# Patient Record
Sex: Male | Born: 1950 | Race: White | Hispanic: No | Marital: Married | Smoking: Former smoker
Health system: Southern US, Community
[De-identification: ages and names within clinical notes are randomized; demographics above are authoritative.]

## PROBLEM LIST (undated history)

## (undated) DIAGNOSIS — K219 Gastro-esophageal reflux disease without esophagitis: Secondary | ICD-10-CM

## (undated) DIAGNOSIS — I10 Essential (primary) hypertension: Secondary | ICD-10-CM

## (undated) DIAGNOSIS — E785 Hyperlipidemia, unspecified: Secondary | ICD-10-CM

## (undated) DIAGNOSIS — Z973 Presence of spectacles and contact lenses: Secondary | ICD-10-CM

## (undated) DIAGNOSIS — M1990 Unspecified osteoarthritis, unspecified site: Secondary | ICD-10-CM

## (undated) DIAGNOSIS — I2510 Atherosclerotic heart disease of native coronary artery without angina pectoris: Secondary | ICD-10-CM

## (undated) DIAGNOSIS — I219 Acute myocardial infarction, unspecified: Secondary | ICD-10-CM

## (undated) DIAGNOSIS — H9190 Unspecified hearing loss, unspecified ear: Secondary | ICD-10-CM

## (undated) HISTORY — PX: COLONOSCOPY: SHX174

---

## 1982-06-27 HISTORY — PX: ANKLE ARTHROPLASTY: SUR68

## 2008-06-27 HISTORY — PX: CORONARY ARTERY BYPASS GRAFT: SHX141

## 2012-07-28 HISTORY — PX: SHOULDER ARTHROSCOPY: SHX128

## 2013-04-09 ENCOUNTER — Other Ambulatory Visit: Payer: Self-pay | Admitting: Orthopedic Surgery

## 2013-04-12 ENCOUNTER — Encounter (HOSPITAL_BASED_OUTPATIENT_CLINIC_OR_DEPARTMENT_OTHER): Payer: Self-pay | Admitting: *Deleted

## 2013-04-12 NOTE — Progress Notes (Signed)
Pt had this shoulder done [place] 2/14-still having pain-problems-to come here for dr sypher-bring all meds and overnight bag-will need istat-had a cardiac work up [place] prior to surgery 2/14-have [facility] notes and ekg

## 2013-04-17 ENCOUNTER — Encounter (HOSPITAL_BASED_OUTPATIENT_CLINIC_OR_DEPARTMENT_OTHER): Payer: Self-pay | Admitting: *Deleted

## 2013-04-17 NOTE — H&P (Signed)
Daryl Cox is an 62 y.o. male.   Chief Complaint: c/o chronic and progressive right shoulder pain  HPI: .  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  He presents for evaluation of his right shoulder.    On 04/21/12 he sustained a significant strain injury pulling a pin on the 5th wheel of a trailer.  He was subsequently evaluated by a number of physicians in [place], [place], ultimately, Dr. Claudean Severance Cox.   He had plain films of his shoulder demonstrating degenerative arthritis of the [facility] joint.    He had a MRI of his shoulder on 05/23/12 demonstrating severe tendinosis of his rotator cuff with a foot print tear of the posterior third of the supraspinatus and a foot print tear of the infraspinatus.  The subscapularis had severe tendinosis.    He was subsequently brought to the operating room on August 16, 2012 where he underwent a right shoulder subacromial decompression, distal clavicle resection, extensive debridement of the labrum, rotator cuff and subacromial space followed by arthroscopic repair of the rotator cuff.  He underwent supervised therapy ultimately reporting significant pain with elevation or abduction of his shoulder.  He has continued to experience weakness and difficulty sleeping on his shoulder at night.  He has been unable to recover strength in forward flexion or adduction.  He requested a second opinion. On January 25, 2013 he had a follow-up MRI accomplished at [facility] and interpreted by Dr. Stephens Cox. Dr. Etta Cox noted postoperative changes with tendinosis and irregularity of the supraspinatus. There was a full thickness tear through the musculotendinous junction of the supraspinatus noted.  There is tendinosis and partial thickness tearing of the subscapularis.  The biceps tendon was torn and dislocated, notably impaired compared to its position in the prior MRI. There was noted to  communication of fluid on glenohumeral arthrogram into the subacromial and acromioclavicular joint spaces.       Past Medical History  Diagnosis Date  . Coronary artery disease   . Hypertension   . Hyperlipemia   . GERD (gastroesophageal reflux disease)   . Arthritis   . Wears glasses   . HOH (hard of hearing)   . Myocardial infarction     Past Surgical History  Procedure Laterality Date  . Ankle arthroplasty  1984    right  . Colonoscopy    . Shoulder arthroscopy  2/14    right-[place]  . Coronary artery bypass graft  2010    History reviewed. No pertinent family history. Social History:  reports that he quit smoking about 4 years ago. He does not have any smokeless tobacco history on file. He reports that he drinks alcohol. He reports that he does not use illicit drugs.  Allergies: No Known Allergies  No prescriptions prior to admission    No results found for this or any previous visit (from the past 48 hour(s)).  No results found.   Pertinent items are noted in HPI.  Height 5\' 8"  (1.727 m), weight 97.07 kg (214 lb).  General appearance: alert Head: Normocephalic, without obvious abnormality Neck: supple, symmetrical, trachea midline Resp: clear to auscultation bilaterally Cardio: regular rate and rhythm GI: normal findings: bowel sounds normal Extremities:.  Inspection of his shoulder reveals healed arthroscopic portals.  He can elevate his right shoulder approximately 140 degrees limited by discomfort, left shoulder 165 degrees.  He can externally rotate 90 degrees abduction right shoulder 80, left  shoulder 90.  He has difficulty internally rotating his hand behind his back on the right and can rotate to at least T-12 on the left.  He has pain with rapid abduction, resisted scaption, resisted forward flexion, resisted abduction in external rotation.    His preoperative MRI is reviewed and demonstrates significant tendinopathy at the rotator cuff and  unfavorable AC anatomy.  His postoperative MRI is reviewed carefully.  He has a problematic residual bone prominence at the anterolateral acromion and residual bone at the distal clavicle that, in my judgement, more likely than not are causing persistent irritation of the cuff.  There is a full thickness, midsubstance rotator cuff tear.  His repair and anchor are well visualized.  The lateral repair is intact.   Pulses: 2+ and symmetric Skin: normal Neurologic: Grossly normal    Assessment/Plan Impression: Right shoulder impingement with RC tear, residual clavicle osteophyte and lateral acromial osteophyte.  Plan: To the OR for right SA with revision SAD/DCR and RC repair.The procedure, risks,benefits and post-op course were discussed with the patient at length and they were in agreement with the plan.  Daryl Cox,Daryl Cox J 04/17/2013, 10:17 PM  H&P documentation: 04/18/2013  -History and Physical Reviewed  -Patient has been re-examined  -No change in the plan of care  Daryl Forster, MD

## 2013-04-18 ENCOUNTER — Encounter (HOSPITAL_BASED_OUTPATIENT_CLINIC_OR_DEPARTMENT_OTHER)
Admission: RE | Disposition: A | Discharge status: Home / Self Care | Payer: Self-pay | Source: Ambulatory Visit | Attending: Orthopedic Surgery

## 2013-04-18 ENCOUNTER — Ambulatory Visit (HOSPITAL_BASED_OUTPATIENT_CLINIC_OR_DEPARTMENT_OTHER): Payer: Worker's Compensation | Admitting: Anesthesiology

## 2013-04-18 ENCOUNTER — Encounter (HOSPITAL_BASED_OUTPATIENT_CLINIC_OR_DEPARTMENT_OTHER): Payer: Worker's Compensation | Admitting: Anesthesiology

## 2013-04-18 ENCOUNTER — Ambulatory Visit (HOSPITAL_BASED_OUTPATIENT_CLINIC_OR_DEPARTMENT_OTHER)
Admission: RE | Admit: 2013-04-18 | Discharge: 2013-04-19 | Disposition: A | Discharge status: Home / Self Care | Payer: Worker's Compensation | Source: Ambulatory Visit | Attending: Orthopedic Surgery | Admitting: Orthopedic Surgery

## 2013-04-18 ENCOUNTER — Encounter (HOSPITAL_BASED_OUTPATIENT_CLINIC_OR_DEPARTMENT_OTHER): Payer: Self-pay | Admitting: *Deleted

## 2013-04-18 DIAGNOSIS — I2510 Atherosclerotic heart disease of native coronary artery without angina pectoris: Secondary | ICD-10-CM | POA: Insufficient documentation

## 2013-04-18 DIAGNOSIS — M898X9 Other specified disorders of bone, unspecified site: Secondary | ICD-10-CM | POA: Insufficient documentation

## 2013-04-18 DIAGNOSIS — M67919 Unspecified disorder of synovium and tendon, unspecified shoulder: Secondary | ICD-10-CM | POA: Insufficient documentation

## 2013-04-18 DIAGNOSIS — H9190 Unspecified hearing loss, unspecified ear: Secondary | ICD-10-CM | POA: Insufficient documentation

## 2013-04-18 DIAGNOSIS — M129 Arthropathy, unspecified: Secondary | ICD-10-CM | POA: Insufficient documentation

## 2013-04-18 DIAGNOSIS — G8929 Other chronic pain: Secondary | ICD-10-CM | POA: Insufficient documentation

## 2013-04-18 DIAGNOSIS — M249 Joint derangement, unspecified: Secondary | ICD-10-CM | POA: Insufficient documentation

## 2013-04-18 DIAGNOSIS — M719 Bursopathy, unspecified: Secondary | ICD-10-CM | POA: Insufficient documentation

## 2013-04-18 DIAGNOSIS — M25819 Other specified joint disorders, unspecified shoulder: Secondary | ICD-10-CM | POA: Insufficient documentation

## 2013-04-18 DIAGNOSIS — I10 Essential (primary) hypertension: Secondary | ICD-10-CM | POA: Insufficient documentation

## 2013-04-18 DIAGNOSIS — K219 Gastro-esophageal reflux disease without esophagitis: Secondary | ICD-10-CM | POA: Insufficient documentation

## 2013-04-18 DIAGNOSIS — I252 Old myocardial infarction: Secondary | ICD-10-CM | POA: Insufficient documentation

## 2013-04-18 DIAGNOSIS — E785 Hyperlipidemia, unspecified: Secondary | ICD-10-CM | POA: Insufficient documentation

## 2013-04-18 HISTORY — DX: Presence of spectacles and contact lenses: Z97.3

## 2013-04-18 HISTORY — PX: SHOULDER OPEN ROTATOR CUFF REPAIR: SHX2407

## 2013-04-18 HISTORY — DX: Unspecified osteoarthritis, unspecified site: M19.90

## 2013-04-18 HISTORY — DX: Acute myocardial infarction, unspecified: I21.9

## 2013-04-18 HISTORY — DX: Atherosclerotic heart disease of native coronary artery without angina pectoris: I25.10

## 2013-04-18 HISTORY — DX: Unspecified hearing loss, unspecified ear: H91.90

## 2013-04-18 HISTORY — DX: Hyperlipidemia, unspecified: E78.5

## 2013-04-18 HISTORY — DX: Essential (primary) hypertension: I10

## 2013-04-18 HISTORY — DX: Gastro-esophageal reflux disease without esophagitis: K21.9

## 2013-04-18 LAB — POCT I-STAT, CHEM 8
BUN: 13 mg/dL (ref 6–23)
Chloride: 106 mEq/L (ref 96–112)
Creatinine, Ser: 1 mg/dL (ref 0.50–1.35)
Glucose, Bld: 117 mg/dL — ABNORMAL HIGH (ref 70–99)
Potassium: 4 mEq/L (ref 3.5–5.1)
Sodium: 139 mEq/L (ref 135–145)

## 2013-04-18 SURGERY — REPAIR, ROTATOR CUFF, OPEN
Anesthesia: General | Site: Shoulder | Laterality: Right | Wound class: Clean

## 2013-04-18 MED ORDER — HYDROMORPHONE HCL 2 MG PO TABS: ORAL_TABLET | ORAL | Status: AC

## 2013-04-18 MED ORDER — LOSARTAN POTASSIUM 25 MG PO TABS
25.0000 mg | ORAL_TABLET | Freq: Every day | ORAL | Status: DC
  Administered 2013-04-18: 25 mg via ORAL

## 2013-04-18 MED ORDER — CHLORHEXIDINE GLUCONATE 4 % EX LIQD: 60.0000 mL | Freq: Once | CUTANEOUS | Status: DC

## 2013-04-18 MED ORDER — LIDOCAINE HCL (CARDIAC) 20 MG/ML IV SOLN
INTRAVENOUS | Status: DC | PRN
  Administered 2013-04-18: 100 mg via INTRAVENOUS

## 2013-04-18 MED ORDER — HYDROMORPHONE HCL PF 1 MG/ML IJ SOLN: 0.5000 mg | INTRAMUSCULAR | Status: DC | PRN

## 2013-04-18 MED ORDER — CEFAZOLIN SODIUM-DEXTROSE 2-3 GM-% IV SOLR
INTRAVENOUS | Status: AC
  Filled 2013-04-18: qty 50

## 2013-04-18 MED ORDER — DEXAMETHASONE SODIUM PHOSPHATE 4 MG/ML IJ SOLN
INTRAMUSCULAR | Status: DC | PRN
  Administered 2013-04-18: 5 mg via INTRAVENOUS

## 2013-04-18 MED ORDER — EPHEDRINE SULFATE 50 MG/ML IJ SOLN
INTRAMUSCULAR | Status: DC | PRN
  Administered 2013-04-18 (×3): 5 mg via INTRAVENOUS

## 2013-04-18 MED ORDER — FENTANYL CITRATE 0.05 MG/ML IJ SOLN
INTRAMUSCULAR | Status: AC
  Filled 2013-04-18: qty 2

## 2013-04-18 MED ORDER — FENTANYL CITRATE 0.05 MG/ML IJ SOLN: 50.0000 ug | Freq: Once | INTRAMUSCULAR | Status: DC

## 2013-04-18 MED ORDER — MIDAZOLAM HCL 2 MG/2ML IJ SOLN
1.0000 mg | INTRAMUSCULAR | Status: DC | PRN
  Administered 2013-04-18: 2 mg via INTRAVENOUS

## 2013-04-18 MED ORDER — OXYCODONE HCL 5 MG/5ML PO SOLN: 5.0000 mg | Freq: Once | ORAL | Status: AC | PRN

## 2013-04-18 MED ORDER — CEFAZOLIN SODIUM-DEXTROSE 2-3 GM-% IV SOLR
2.0000 g | Freq: Once | INTRAVENOUS | Status: AC
  Administered 2013-04-18: 2 g via INTRAVENOUS

## 2013-04-18 MED ORDER — ONDANSETRON HCL 4 MG PO TABS: 4.0000 mg | ORAL_TABLET | Freq: Four times a day (QID) | ORAL | Status: DC | PRN

## 2013-04-18 MED ORDER — OXYCODONE-ACETAMINOPHEN 5-325 MG PO TABS: 1.0000 | ORAL_TABLET | ORAL | Status: DC | PRN

## 2013-04-18 MED ORDER — BUPIVACAINE-EPINEPHRINE PF 0.5-1:200000 % IJ SOLN
INTRAMUSCULAR | Status: DC | PRN
  Administered 2013-04-18: 25 mL

## 2013-04-18 MED ORDER — FENTANYL CITRATE 0.05 MG/ML IJ SOLN
INTRAMUSCULAR | Status: DC | PRN
  Administered 2013-04-18: 25 ug via INTRAVENOUS
  Administered 2013-04-18: 50 ug via INTRAVENOUS
  Administered 2013-04-18: 25 ug via INTRAVENOUS

## 2013-04-18 MED ORDER — FLUTICASONE PROPIONATE 50 MCG/ACT NA SUSP: 2.0000 | Freq: Every day | NASAL | Status: DC

## 2013-04-18 MED ORDER — METOCLOPRAMIDE HCL 5 MG/ML IJ SOLN: 5.0000 mg | Freq: Three times a day (TID) | INTRAMUSCULAR | Status: DC | PRN

## 2013-04-18 MED ORDER — METOCLOPRAMIDE HCL 5 MG PO TABS: 5.0000 mg | ORAL_TABLET | Freq: Three times a day (TID) | ORAL | Status: DC | PRN

## 2013-04-18 MED ORDER — SODIUM CHLORIDE 0.9 % IV SOLN
INTRAVENOUS | Status: DC
  Administered 2013-04-18: 20 mL/h via INTRAVENOUS

## 2013-04-18 MED ORDER — PROMETHAZINE HCL 25 MG/ML IJ SOLN: 6.2500 mg | INTRAMUSCULAR | Status: DC | PRN

## 2013-04-18 MED ORDER — HYDROMORPHONE HCL PF 1 MG/ML IJ SOLN: 0.2500 mg | INTRAMUSCULAR | Status: DC | PRN

## 2013-04-18 MED ORDER — PROPOFOL 10 MG/ML IV BOLUS
INTRAVENOUS | Status: DC | PRN
  Administered 2013-04-18: 20 mg via INTRAVENOUS
  Administered 2013-04-18: 160 mg via INTRAVENOUS

## 2013-04-18 MED ORDER — MIDAZOLAM HCL 2 MG/2ML IJ SOLN: 1.0000 mg | INTRAMUSCULAR | Status: DC | PRN

## 2013-04-18 MED ORDER — ONDANSETRON HCL 4 MG/2ML IJ SOLN
INTRAMUSCULAR | Status: DC | PRN
  Administered 2013-04-18: 4 mg via INTRAVENOUS

## 2013-04-18 MED ORDER — CEPHALEXIN 500 MG PO CAPS: 500.0000 mg | ORAL_CAPSULE | Freq: Three times a day (TID) | ORAL | Status: AC

## 2013-04-18 MED ORDER — ONDANSETRON HCL 4 MG/2ML IJ SOLN: 4.0000 mg | Freq: Four times a day (QID) | INTRAMUSCULAR | Status: DC | PRN

## 2013-04-18 MED ORDER — SUCCINYLCHOLINE CHLORIDE 20 MG/ML IJ SOLN
INTRAMUSCULAR | Status: DC | PRN
  Administered 2013-04-18: 100 mg via INTRAVENOUS

## 2013-04-18 MED ORDER — OXYCODONE HCL 5 MG PO TABS: 5.0000 mg | ORAL_TABLET | Freq: Once | ORAL | Status: AC | PRN

## 2013-04-18 MED ORDER — PROPOFOL 10 MG/ML IV BOLUS
INTRAVENOUS | Status: AC
  Filled 2013-04-18: qty 40

## 2013-04-18 MED ORDER — CEFAZOLIN SODIUM-DEXTROSE 2-3 GM-% IV SOLR
2.0000 g | Freq: Four times a day (QID) | INTRAVENOUS | Status: DC
  Administered 2013-04-18 – 2013-04-19 (×2): 2 g via INTRAVENOUS

## 2013-04-18 MED ORDER — METOPROLOL SUCCINATE ER 25 MG PO TB24
25.0000 mg | ORAL_TABLET | Freq: Every day | ORAL | Status: DC
  Administered 2013-04-18: 25 mg via ORAL

## 2013-04-18 MED ORDER — LACTATED RINGERS IV SOLN
INTRAVENOUS | Status: DC
  Administered 2013-04-18 (×3): via INTRAVENOUS

## 2013-04-18 MED ORDER — FAMOTIDINE 20 MG PO TABS
20.0000 mg | ORAL_TABLET | Freq: Two times a day (BID) | ORAL | Status: DC
  Administered 2013-04-18: 20 mg via ORAL

## 2013-04-18 MED ORDER — MIDAZOLAM HCL 2 MG/2ML IJ SOLN
INTRAMUSCULAR | Status: AC
  Filled 2013-04-18: qty 2

## 2013-04-18 MED ORDER — FENTANYL CITRATE 0.05 MG/ML IJ SOLN
50.0000 ug | INTRAMUSCULAR | Status: DC | PRN
  Administered 2013-04-18: 100 ug via INTRAVENOUS

## 2013-04-18 MED ORDER — LIDOCAINE HCL 2 % IJ SOLN
INTRAMUSCULAR | Status: AC
  Filled 2013-04-18: qty 20

## 2013-04-18 MED ORDER — DEXAMETHASONE SODIUM PHOSPHATE 4 MG/ML IJ SOLN
INTRAMUSCULAR | Status: DC | PRN
  Administered 2013-04-18: 4 mg

## 2013-04-18 SURGICAL SUPPLY — 69 items
ANCHOR BIO SWLOCK 4.75 W/TIG (Anchor) ×2 IMPLANT
ANCHOR SUT BIO SW 4.75 W/FIB (Anchor) ×2 IMPLANT
ANCHOR SUT BIO SW 4.75X19.1 (Anchor) ×8 IMPLANT
BANDAGE ADHESIVE 1X3 (GAUZE/BANDAGES/DRESSINGS) IMPLANT
BLADE AVERAGE 25X9 (BLADE) IMPLANT
BLADE SURG 15 STRL LF DISP TIS (BLADE) ×3 IMPLANT
BLADE SURG 15 STRL SS (BLADE) ×3
BUR EGG/OVAL CARBIDE (BURR) ×2 IMPLANT
CANISTER SUCT 3000ML (MISCELLANEOUS) IMPLANT
CANNULA TWIST IN 8.25X7CM (CANNULA) ×4 IMPLANT
CLEANER CAUTERY TIP 5X5 PAD (MISCELLANEOUS) IMPLANT
DECANTER SPIKE VIAL GLASS SM (MISCELLANEOUS) IMPLANT
DRAPE INCISE IOBAN 66X45 STRL (DRAPES) ×2 IMPLANT
DRAPE STERI 35X30 U-POUCH (DRAPES) ×2 IMPLANT
DRAPE SURG 17X23 STRL (DRAPES) ×2 IMPLANT
DRAPE U-SHAPE 47X51 STRL (DRAPES) ×2 IMPLANT
DRAPE U-SHAPE 76X120 STRL (DRAPES) ×4 IMPLANT
DRSG PAD ABDOMINAL 8X10 ST (GAUZE/BANDAGES/DRESSINGS) ×4 IMPLANT
DURAPREP 26ML APPLICATOR (WOUND CARE) ×2 IMPLANT
ELECT REM PT RETURN 9FT ADLT (ELECTROSURGICAL) ×2
ELECTRODE REM PT RTRN 9FT ADLT (ELECTROSURGICAL) ×1 IMPLANT
GAUZE SPONGE 4X4 16PLY XRAY LF (GAUZE/BANDAGES/DRESSINGS) IMPLANT
GLOVE BIO SURGEON STRL SZ 6.5 (GLOVE) ×2 IMPLANT
GLOVE BIOGEL M STRL SZ7.5 (GLOVE) ×2 IMPLANT
GLOVE BIOGEL PI IND STRL 7.0 (GLOVE) ×2 IMPLANT
GLOVE BIOGEL PI INDICATOR 7.0 (GLOVE) ×2
GLOVE ORTHO TXT STRL SZ7.5 (GLOVE) ×2 IMPLANT
GOWN BRE IMP PREV XXLGXLNG (GOWN DISPOSABLE) ×4 IMPLANT
GOWN PREVENTION PLUS XLARGE (GOWN DISPOSABLE) ×2 IMPLANT
NDL SUT 6 .5 CRC .975X.05 MAYO (NEEDLE) ×1 IMPLANT
NEEDLE KEITH (NEEDLE) IMPLANT
NEEDLE KEITH SZ10 STRAIGHT (NEEDLE) IMPLANT
NEEDLE MAYO TAPER (NEEDLE) ×1
NEEDLE MAYO TROCAR (NEEDLE) IMPLANT
NEEDLE SCORPION (NEEDLE) ×2 IMPLANT
PACK ARTHROSCOPY DSU (CUSTOM PROCEDURE TRAY) ×2 IMPLANT
PACK BASIN DAY SURGERY FS (CUSTOM PROCEDURE TRAY) ×2 IMPLANT
PAD CLEANER CAUTERY TIP 5X5 (MISCELLANEOUS)
PASSER SUT SWANSON 36MM LOOP (INSTRUMENTS) IMPLANT
PENCIL BUTTON HOLSTER BLD 10FT (ELECTRODE) ×2 IMPLANT
SLEEVE SCD COMPRESS KNEE MED (MISCELLANEOUS) ×2 IMPLANT
SLING ARM FOAM STRAP LRG (SOFTGOODS) ×2 IMPLANT
SLING ARM FOAM STRAP MED (SOFTGOODS) IMPLANT
SPONGE GAUZE 4X4 12PLY (GAUZE/BANDAGES/DRESSINGS) ×2 IMPLANT
SPONGE LAP 4X18 X RAY DECT (DISPOSABLE) ×4 IMPLANT
STRIP CLOSURE SKIN 1/2X4 (GAUZE/BANDAGES/DRESSINGS) IMPLANT
SUCTION FRAZIER TIP 10 FR DISP (SUCTIONS) IMPLANT
SUT ETHIBOND 2 OS 4 DA (SUTURE) IMPLANT
SUT ETHILON 4 0 PS 2 18 (SUTURE) IMPLANT
SUT FIBERWIRE #2 38 T-5 BLUE (SUTURE)
SUT FIBERWIRE 3-0 18 TAPR NDL (SUTURE)
SUT PROLENE 3 0 PS 2 (SUTURE) ×2 IMPLANT
SUT VIC AB 0 CT1 27 (SUTURE)
SUT VIC AB 0 CT1 27XBRD ANBCTR (SUTURE) IMPLANT
SUT VIC AB 0 SH 27 (SUTURE) ×4 IMPLANT
SUT VIC AB 2-0 SH 27 (SUTURE) ×1
SUT VIC AB 2-0 SH 27XBRD (SUTURE) ×1 IMPLANT
SUT VIC AB 3-0 SH 27 (SUTURE)
SUT VIC AB 3-0 SH 27X BRD (SUTURE) IMPLANT
SUT VIC AB 3-0 X1 27 (SUTURE) IMPLANT
SUTURE FIBERWR #2 38 T-5 BLUE (SUTURE) IMPLANT
SUTURE FIBERWR 3-0 18 TAPR NDL (SUTURE) IMPLANT
SYR 3ML 23GX1 SAFETY (SYRINGE) IMPLANT
SYR BULB 3OZ (MISCELLANEOUS) ×2 IMPLANT
TAPE FIBER 2MM 7IN #2 BLUE (SUTURE) ×4 IMPLANT
TAPE HYPAFIX 6X30 (GAUZE/BANDAGES/DRESSINGS) ×2 IMPLANT
TOWEL OR 17X24 6PK STRL BLUE (TOWEL DISPOSABLE) ×2 IMPLANT
TUBE CONNECTING 20X1/4 (TUBING) ×6 IMPLANT
YANKAUER SUCT BULB TIP NO VENT (SUCTIONS) IMPLANT

## 2013-04-18 NOTE — Brief Op Note (Signed)
04/18/2013  3:57 PM  PATIENT:  Daryl Cox  62 y.o. male  PRE-OPERATIVE DIAGNOSIS:  RIGHT ROTATOR CUFF TEAR Clavicle osteophyte, subacromial impingement  POST-OPERATIVE DIAGNOSIS:  right rotator cuff tear, bone spurs impingement at distal clavicle and lateral medial acromion subscapularis grade 2 tear  PROCEDURE:  Procedure(s): RIGHT SHOULDER ARTHROSCOPY REVISION ACROMIOPLASTY DISTAL CLAVICLE RESECTION OPEN RECONSTRUCTION  ROTATOR CUFF, ARTHROSCOPIC REPAIR SUBSCAPULARIS  (Right)  SURGEON:  Surgeon(s) and Role:    * Wyn Forster., MD - Primary  PHYSICIAN ASSISTANT:   ASSISTANTS: Mallory Shirk.A-C   ANESTHESIA:   general  EBL:  Total I/O In: 2000 [I.V.:2000] Out: -   BLOOD ADMINISTERED:none  DRAINS: none   LOCAL MEDICATIONS USED:  Ropivacaine plexus block  SPECIMEN:  No Specimen  DISPOSITION OF SPECIMEN:  N/A  COUNTS:  YES  TOURNIQUET:  * No tourniquets in log *  DICTATION: .Other Dictation: Dictation Number [id number]  PLAN OF CARE: Admit for overnight observation  PATIENT DISPOSITION:  PACU - hemodynamically stable.   Delay start of Pharmacological VTE agent (>24hrs) due to surgical blood loss or risk of bleeding: not applicable

## 2013-04-18 NOTE — Transfer of Care (Signed)
Immediate Anesthesia Transfer of Care Note  Patient: Daryl Cox  Procedure(s) Performed: Procedure(s): RIGHT SHOULDER ARTHROSCOPY REVISION ACROMIOPLASTY DISTAL CLAVICLE RESECTION OPEN RECONSTRUCTION  ROTATOR CUFF, ARTHROSCOPIC REPAIR SUBSCAPULARIS  (Right)  Patient Location: PACU  Anesthesia Type:GA combined with regional for post-op pain  Level of Consciousness: awake, alert  and oriented  Airway & Oxygen Therapy: Patient Spontanous Breathing and Patient connected to face mask oxygen  Post-op Assessment: Report given to PACU RN and Post -op Vital signs reviewed and stable  Post vital signs: Reviewed and stable  Complications: No apparent anesthesia complications

## 2013-04-18 NOTE — Anesthesia Postprocedure Evaluation (Signed)
  Anesthesia Post-op Note  Patient: Daryl Cox  Procedure(s) Performed: Procedure(s): RIGHT SHOULDER ARTHROSCOPY REVISION ACROMIOPLASTY DISTAL CLAVICLE RESECTION OPEN RECONSTRUCTION  ROTATOR CUFF, ARTHROSCOPIC REPAIR SUBSCAPULARIS  (Right)  Patient Location: PACU  Anesthesia Type:GA combined with regional for post-op pain  Level of Consciousness: awake, alert  and oriented  Airway and Oxygen Therapy: Patient Spontanous Breathing and Patient connected to face mask oxygen  Post-op Pain: none  Post-op Assessment: Post-op Vital signs reviewed  Post-op Vital Signs: Reviewed  Complications: No apparent anesthesia complications

## 2013-04-18 NOTE — Anesthesia Preprocedure Evaluation (Signed)
Anesthesia Evaluation  Patient identified by MRN, date of birth, ID band Patient awake    Reviewed: Allergy & Precautions, H&P , NPO status , Patient's Chart, lab work & pertinent test results  Airway Mallampati: II TM Distance: >3 FB Neck ROM: Full    Dental   Pulmonary  breath sounds clear to auscultation        Cardiovascular hypertension, + CAD and + Past MI Rhythm:Regular Rate:Normal     Neuro/Psych    GI/Hepatic GERD-  ,  Endo/Other    Renal/GU      Musculoskeletal   Abdominal   Peds  Hematology   Anesthesia Other Findings   Reproductive/Obstetrics                           Anesthesia Physical Anesthesia Plan  ASA: III  Anesthesia Plan: General   Post-op Pain Management:    Induction: Intravenous  Airway Management Planned: Oral ETT  Additional Equipment:   Intra-op Plan:   Post-operative Plan: Extubation in OR  Informed Consent: I have reviewed the patients History and Physical, chart, labs and discussed the procedure including the risks, benefits and alternatives for the proposed anesthesia with the patient or authorized representative who has indicated his/her understanding and acceptance.     Plan Discussed with: CRNA and Surgeon  Anesthesia Plan Comments:         Anesthesia Quick Evaluation

## 2013-04-18 NOTE — Interval H&P Note (Signed)
History and Physical Interval Note:  04/18/2013 1:06 PM  Daryl Cox  has presented today for surgery, with the diagnosis of RIGHT ROTATOR CUFF TEAR  The various methods of treatment have been discussed with the patient and family. After consideration of risks, benefits and other options for treatment, the patient has consented to  Procedure(s): RIGHT SHOULDER ARTHROSCOPY ROTATOR CUFF REPAIR SHOULDER OPEN REVISION ACROMIOPLASTY,REVISION CLAVICE (Right) as a surgical intervention .  The patient's history has been reviewed, patient examined, no change in status, stable for surgery.  I have reviewed the patient's chart and labs.  Questions were answered to the patient's satisfaction.     Johnattan Strassman JR,Khiyan Crace V

## 2013-04-18 NOTE — Op Note (Signed)
[id number] 

## 2013-04-18 NOTE — Progress Notes (Signed)
Assisted Dr. Kasik with right, ultrasound guided, interscalene  block. Side rails up, monitors on throughout procedure. See vital signs in flow sheet. Tolerated Procedure well. 

## 2013-04-18 NOTE — Anesthesia Procedure Notes (Addendum)
Anesthesia Regional Block:  Interscalene brachial plexus block  Pre-Anesthetic Checklist: ,, timeout performed, Correct Patient, Correct Site, Correct Laterality, Correct Procedure, Correct Position, site marked, Risks and benefits discussed,  Surgical consent,  Pre-op evaluation,  At surgeon's request and post-op pain management  Laterality: Right  Prep: chloraprep       Needles:   Needle Type: Echogenic Stimulator Needle     Needle Length: 5cm 5 cm Needle Gauge: 22 and 22 G    Additional Needles:  Procedures: ultrasound guided (picture in chart) and nerve stimulator Interscalene brachial plexus block  Nerve Stimulator or Paresthesia:  Response: 0.48 mA,   Additional Responses:   Narrative:  Start time: 04/18/2013 10:06 AM End time: 04/18/2013 10:14 AM Injection made incrementally with aspirations every 5 mL. Anesthesiologist: Dr Gypsy Balsam  Additional Notes: [id number] R ISB POP CHG prep, sterile tech #22 stim/echo needle with stim down to .48ma and good [place] visualization-PIX in chart Multiple neg asp Marc .55 w/epi 1:200000 total 25cc+decadron 4mg  infil No compl Dr Gypsy Balsam   Performed by: Signa Kell C    Procedure Name: Intubation Date/Time: 04/18/2013 1:22 PM Performed by: Burna Cash Pre-anesthesia Checklist: Patient identified, Emergency Drugs available, Suction available and Patient being monitored Patient Re-evaluated:Patient Re-evaluated prior to inductionOxygen Delivery Method: Circle System Utilized Preoxygenation: Pre-oxygenation with 100% oxygen Intubation Type: IV induction Ventilation: Mask ventilation without difficulty Laryngoscope Size: Mac and 3 Grade View: Grade I Tube type: Oral Tube size: 8.0 mm Number of attempts: 1 Airway Equipment and Method: stylet and oral airway Placement Confirmation: ETT inserted through vocal cords under direct vision,  positive ETCO2 and breath sounds checked- equal and bilateral Secured at: 22 cm Tube  secured with: Tape Dental Injury: Teeth and Oropharynx as per pre-operative assessment

## 2013-04-19 MED ORDER — CEFAZOLIN SODIUM-DEXTROSE 2-3 GM-% IV SOLR
INTRAVENOUS | Status: AC
  Filled 2013-04-19: qty 50

## 2013-04-19 MED ORDER — IBUPROFEN 600 MG PO TABS
ORAL_TABLET | ORAL | Status: AC
  Filled 2013-04-19: qty 1

## 2013-04-19 MED ORDER — IBUPROFEN 600 MG PO TABS
600.0000 mg | ORAL_TABLET | Freq: Four times a day (QID) | ORAL | Status: DC | PRN
  Administered 2013-04-19: 600 mg via ORAL

## 2013-04-19 NOTE — Op Note (Signed)
Daryl Cox                ACCOUNT NO.:  [id number]  MEDICAL RECORD NO.:  [id number]  LOCATION:                                 FACILITY:  PHYSICIAN:  Daryl Cox, M.D.      DATE OF BIRTH:  DATE OF PROCEDURE:  04/18/2013 DATE OF DISCHARGE:                              OPERATIVE REPORT   PREOPERATIVE DIAGNOSIS:  Rotator cuff tear status post prior arthroscopic repair of rotator cuff performed on August 16, 2012, with subsequent MRI obtained on January 25, 2013, documenting a new rotator cuff tear with a number of technical issues leading to chronic right shoulder pain.  POSTOPERATIVE DIAGNOSIS: 1. Persistent distal clavicle osteophyte causing subclavicular     impingement. 2. Lateral and medial acromial osteophytes abrading bursa and re-torn     supraspinatus rotator cuff tendon. 3. New identification of a grade 2 subscapularis rotator cuff tendon     tear. 4. Chronic long head of biceps rupture. 5. Extensive synovitis and labral degenerative changes.  OPERATION: 1. Diagnostic arthroscopy of right glenohumeral joint noting the     aforementioned pathology including a chronic complete rupture of     the long head of the biceps, labral degenerative changes with     instability, a grade 2 subscapularis tear, a rupture of the prior     rotator cuff repair with retraction and a large bursal leader, and     subsequent arthroscopic evaluation of the subacromial space     revealed significant tendinopathy of the supraspinatus with a donut     hole type mid substance tear with inferior clavicle osteophyte     impingement and in my judgment medial acromial and lateral acromial     residual impingement. 2. Arthroscopic debridement of labrum and rotator cuff/greater     tuberosity. 3. Arthroscopic repair of a grade 2 subscapularis tear with a 4.75 mm     swivel lock and fiber tape reverse mattress suture. 4. Arthroscopic revision of distal clavicle resection with removal      about 1 cm of clavicle and beveling of inferior edge. 5. Arthroscopic subacromial revision with further medial and lateral     acromioplasty with extensive bursectomy and tenolysis of rotator     cuff. 6. Open resection of bursal leader, removal of prior fiber tape     sutures, impaction of prior swivel lock, and reconstruction of     rotator cuff with advanced supraspinatus and closure of rotator     interval utilizing a diamond back reconstruction technique in the     manner of Daryl Cox.  OPERATING SURGEON:  Daryl Cox, M.D.  ASSISTANT:  Daryl Reeks Dasnoit, PA-C  ANESTHESIA:  General endotracheal supplemented by a right ropivacaine plexus block.  SUPERVISING ANESTHESIOLOGIST:  Daryl Cox, M.D.  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  He was brought for an independent medical evaluation by his nurse case manager due to chronic pain and weakness following a prior rotator cuff repair performed arthroscopically in February 2014.  Daryl Cox completed therapy and had residual pain, significant weakness, functional impairment, and significant sleep impairment.  He had a  followup MRI of the shoulder obtained on January 25, 2013, which revealed a new tear in the mid substance of his rotator cuff and a number of technical issues.  He was brought for an independent medical evaluation, and on review in my judgment had a mid substance rotator interval and musculotendinous junction tear of the supraspinatus and what appeared to be a disruption of his arthroscopic repair and retraction of the supraspinatus, abnormal signal in the subscapularis, and a inferior projecting osteophyte of the distal clavicle and osteophytes at the medial acromion and lateral acromion that in my judgment could be problematic.  I advised Daryl Cox to undergo repeat arthroscopy to delineate with high definition images his pathology followed by  combination of arthroscopic and open repair of his predicament.  In my experience, arthroscopic repair of midsubstance tear is extremely challenging.  Preoperatively questions invited and answered in detail.  He was interviewed by Daryl Cox of Anesthesia who provided detailed anesthesia informed consent.  After a discussion of anesthesia choices, Daryl Cox placed a ropivacaine brachial plexus block with ultrasound control for perioperative comfort.  Daryl Cox was provided 2 g of Ancef as an IV prophylactic antibiotic.  PROCEDURE:  Daryl Cox was brought to room 1 of the [facility] and placed in a supine position on the operating table.  Following the induction of general endotracheal anesthesia under Daryl Cox' direct supervision, he was carefully positioned in the beach- chair position with the aid of a torso and head holder designed for shoulder arthroscopy.  The entire right upper extremity and forequarter were prepped with DuraPrep and draped with impervious arthroscopy drapes.  Following a routine surgical time-out, we instrumented the shoulder with the arthroscope through a standard posterior viewing portal with blunt technique.  Diagnostic arthroscopy confirmed the aforementioned pathology of the labrum, rotator cuff, the failure of the prior rotator cuff repair, and a grade 2 subscapularis tear.  An anterior portal was created under direct vision followed by use of a suction shaver to debride the labrum, debride synovitis, debride the rotator cuff tear, prepare the lesser tuberosity for subscapularis repair, debride the greater tuberosity, and to create an anterior superior lateral portal sounded with a spinal needle followed by placement of a clear cannula followed by arthroscopic repair of the subscapularis in the manner Daryl Cox using a reverse mattress suture and a 4.75 mm swivel lock to a proper anatomic footprint.  Thereafter we lavaged the shoulder and  removed the arthroscopic equipment and placed the scope in the subacromial space.  There was florid bursitis in the subacromial space and a chewed up, very ragged supraspinatus with a donut hole type tear at the anterior aspect of the rotator interval and musculotendinous junction.  This was documented with the digital camera.  There was a prominent residual clavicle and some prominence at the medial acromion and some prominence laterally.  We performed a thorough arthroscopic bursectomy.  The coracoacromial ligament had been released.  The acromion was leveled to a type 1 morphology taking care not to thin the central aspect that was already rather thin and the medial and lateral osteophytes were leveled.  An anterior portal was created into the bursal space and the distal centimeter of clavicle removed taking care to assure that no contact with the rotator cuff would occur.  Photographic documentation of the final resection of the clavicle was accomplished with a digital camera.  After thorough cleaning of the subacromial space, it was very clear that there was retraction of  the supraspinatus and in my judgment open reconstruction of this predicament was appropriate.  We removed the arthroscopic equipment and extended the anterior superior lateral portal through a 4 cm muscle-splitting incision.  About 1.5 cm of thickened bursa was debrided with scissors and forceps followed by hemostasis.  We then carefully examined the cuff predicament.  I used the arthroscopic camera to document the tear through the incision so that the pathology would be fully understood.  We carefully ascertained that there was a long bursal leader that was resected followed by advancement of the supraspinatus and infraspinatus followed by use of a hand-held power bur to lower the profile of the greater tuberosity approximately two mm to a  bleeding bone surface.  We then performed a Daryl Cox dimondback  repair with 2 medial Swivel locks with FiberWire sutures for an anatomic footprint along the joint margin with use of a total of 3 lateral Swivel locks.  A second reverse mattress suture of fiber tape was used at the junction of the infraspinatus and supraspinatus to neutralize pull on this muscle tendon unit to relieve some of the tension on the repair.  The diamondback repair was accomplished after resection of the bursal leader.  The redundant bursa was then repaired over the fiber tapes to prevent scraping beneath the acromion.   A very satisfactory construct was achieved.  We then replaced the scope in the glenohumeral joint and confirmed that we had an anatomic footprint for the supraspinatus reestablished to the articular margin.  The wounds were thoroughly lavaged with sterile saline using the arthroscopic cannula.  The muscle split was then anatomically repaired with figure-of-eight sutures of 0-Vicryl reinforcing the acromial attachment of the deltoid.  After further irrigation, the skin was repaired with subcutaneous 2-0 Vicryl and intradermal 3-0 Prolene.  Compressive dressing was applied with sterile gauze, ABD pad, and paper tape.  Daryl Cox was awakened from general anesthesia, placed in a sling, and transferred to the recovery room with stable vital signs.  We documented our findings thoroughly with a digital camera throughout the procedure.  Questions were invited and answered in detail.  We will place him in the recovery care center for a 20-hour observation with prophylactic antibiotics in the form of Ancef 2 g q.6 hours and appropriate pain medication in the form p.o. and IV Dilaudid.  His routine medications will be continued.     Daryl Fitch Terria Deschepper, M.D.     RVS/MEDQ  D:  04/18/2013  T:  04/19/2013  Job:  [id number]

## 2013-04-23 ENCOUNTER — Encounter (HOSPITAL_BASED_OUTPATIENT_CLINIC_OR_DEPARTMENT_OTHER): Payer: Self-pay | Admitting: Orthopedic Surgery

## 2013-11-20 ENCOUNTER — Other Ambulatory Visit (HOSPITAL_COMMUNITY): Payer: Self-pay | Admitting: Orthopedic Surgery

## 2013-11-20 DIAGNOSIS — M25511 Pain in right shoulder: Secondary | ICD-10-CM

## 2013-12-02 ENCOUNTER — Ambulatory Visit (HOSPITAL_COMMUNITY)
Admission: RE | Admit: 2013-12-02 | Discharge: 2013-12-02 | Disposition: A | Discharge status: Home / Self Care | Payer: Worker's Compensation | Source: Ambulatory Visit | Attending: Orthopedic Surgery | Admitting: Orthopedic Surgery

## 2013-12-02 DIAGNOSIS — S46819A Strain of other muscles, fascia and tendons at shoulder and upper arm level, unspecified arm, initial encounter: Secondary | ICD-10-CM | POA: Insufficient documentation

## 2013-12-02 DIAGNOSIS — M25511 Pain in right shoulder: Secondary | ICD-10-CM

## 2013-12-02 DIAGNOSIS — X58XXXA Exposure to other specified factors, initial encounter: Secondary | ICD-10-CM | POA: Insufficient documentation

## 2013-12-02 DIAGNOSIS — M25519 Pain in unspecified shoulder: Secondary | ICD-10-CM | POA: Insufficient documentation

## 2013-12-02 DIAGNOSIS — S43499A Other sprain of unspecified shoulder joint, initial encounter: Secondary | ICD-10-CM | POA: Insufficient documentation

## 2014-11-21 IMAGING — US US EXTREM UP*R* COMP
1 series · 14 of 17 positions shown · non-contrast
Comparison: None.

CLINICAL DATA: Recurrent right shoulder pain. Two prior rotator
cuff surgeries.

EXAM:
ULTRASOUND OF THE RIGHT SHOULDER COMPLETE
TECHNIQUE: Ultrasound examination was performed including evaluation of the
muscles, tendons, joint, and adjacent soft tissues.

[Series 1: us extrem up*right* comp · 17 acquisitions, 14 frames shown]
[im 1/17]
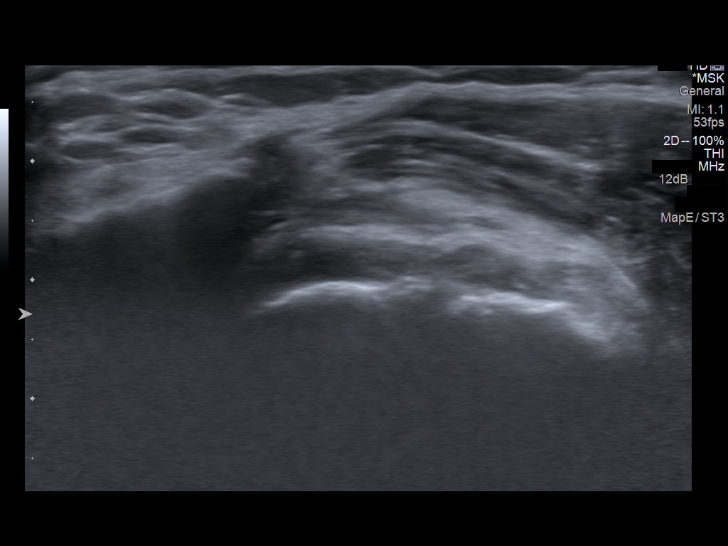
[im 2/17]
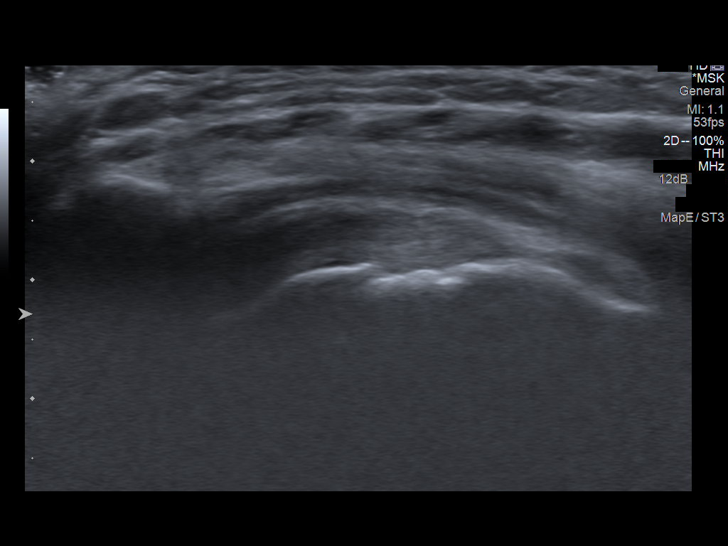
[im 4/17]
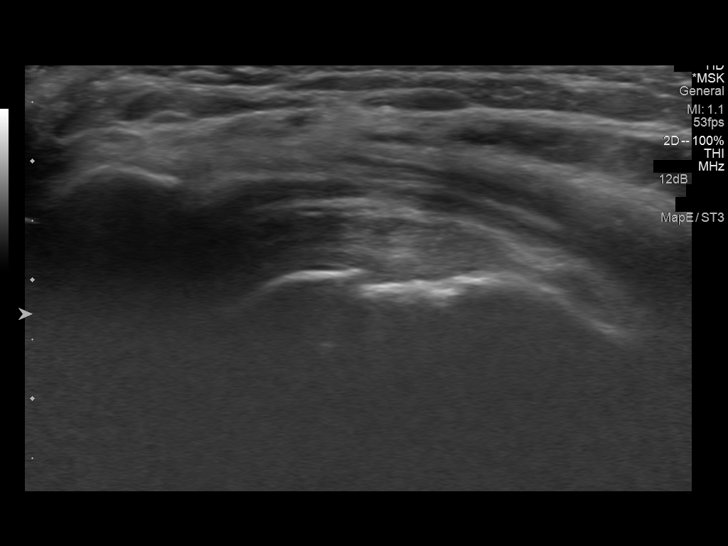
[im 5/17]
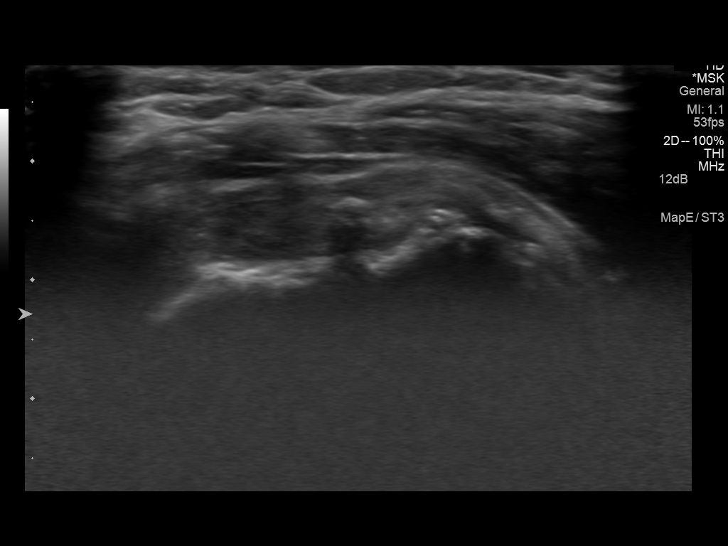
[im 6/17]
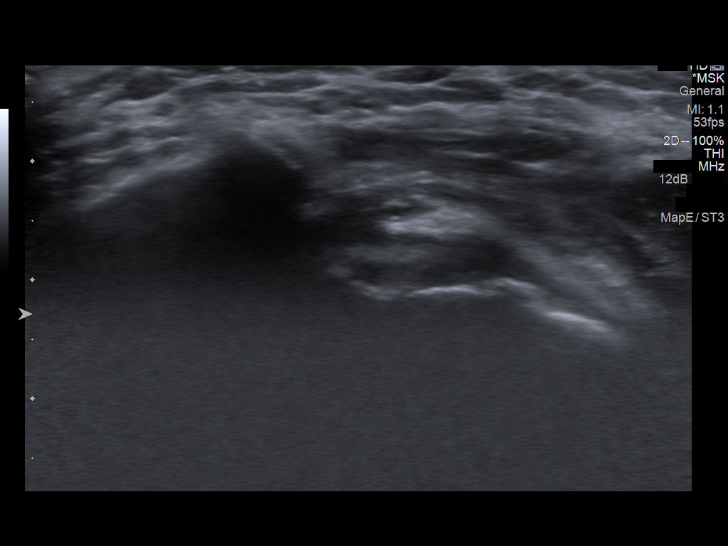
[im 7/17]
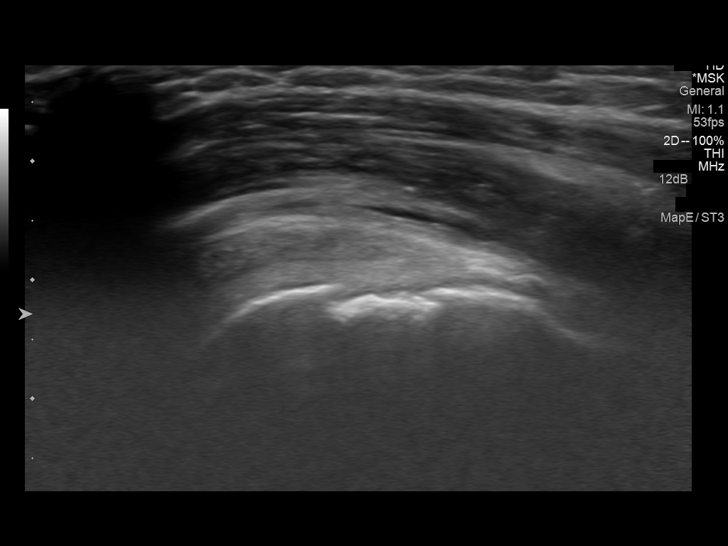
[im 8/17]
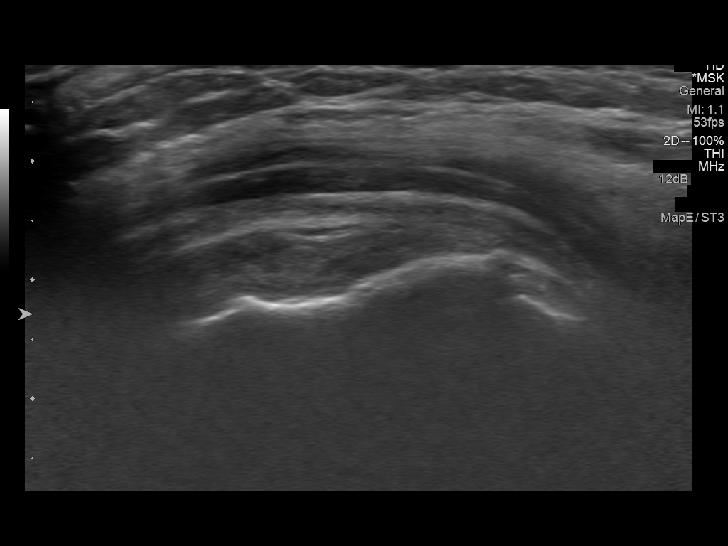
[im 10/17]
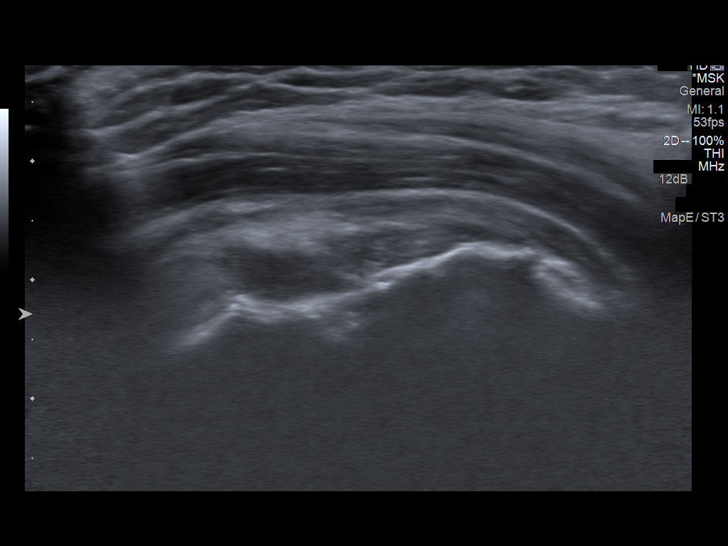
[im 11/17]
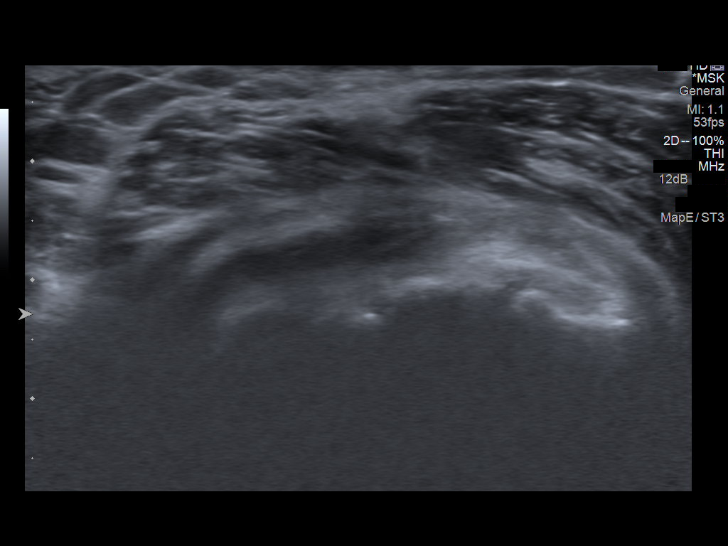
[im 12/17]
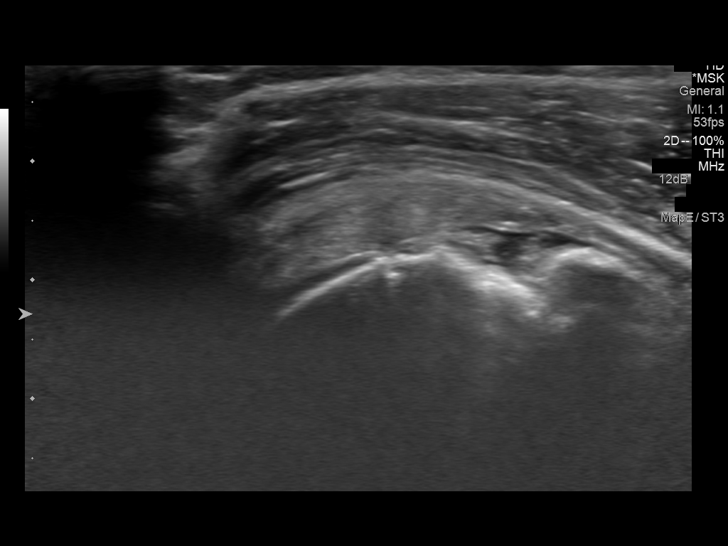
[im 13/17]
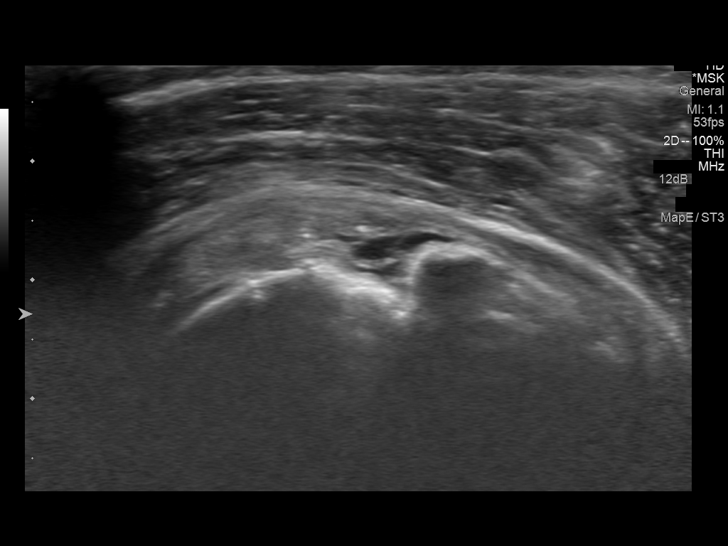
[im 14/17]
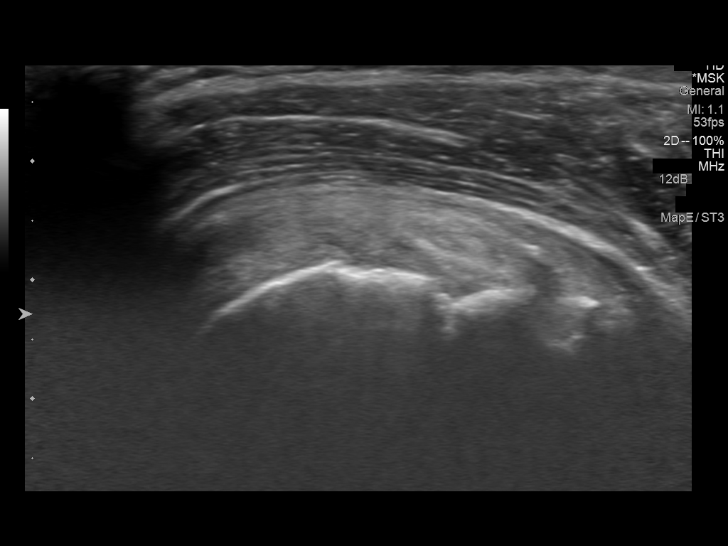
[im 16/17]
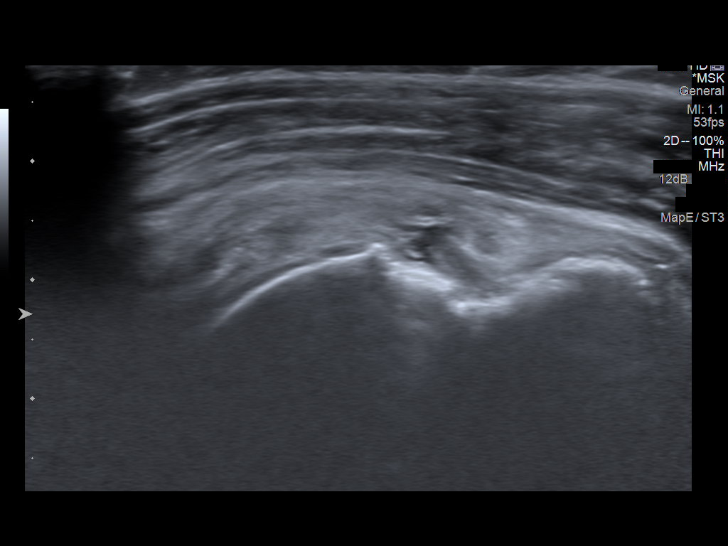
[im 17/17]
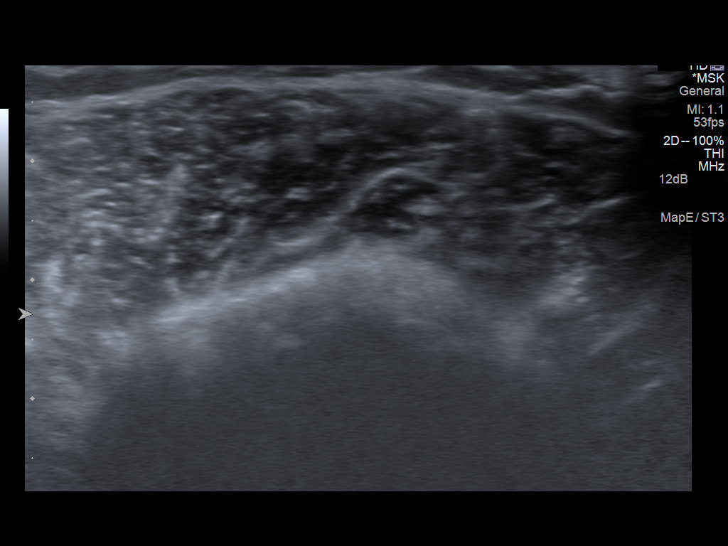

[14 of 17 positions shown; findings below may reference images not displayed]

FINDINGS: I am unable to identify the long head of the biceps tendon in the
bicipital groove. I suspect it has been avulsed and distally
retracted.

There appears to be a delamination type tear of the subscapularis
tendon and a extending to the articular surface. There is fluid in
the overlying subdeltoid bursa.

The last 2 video sequences demonstrate a small focal articular
surface partial thickness tear of the distal supraspinatus tendon.

The infraspinatus and teres minor tendons are intact.
IMPRESSION: 1. The long head of the biceps tendon appears to be avulsed and
distally retracted.
2. Delamination type tear of the distal subscapularis extending to
the articular surface. No retraction.
3. Small focal articular surface partial thickness tear of the
distal supraspinatus tendon.
